# Patient Record
Sex: Male | Born: 2000 | Race: Black or African American | Hispanic: No | Marital: Single
Health system: Southern US, Community
[De-identification: ages and names within clinical notes are randomized; demographics above are authoritative.]

---
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
## 2020-04-18 ENCOUNTER — Ambulatory Visit: Payer: Federal, State, Local not specified - PPO

## 2020-04-25 ENCOUNTER — Ambulatory Visit: Payer: Federal, State, Local not specified - PPO | Attending: Family

## 2020-04-25 DIAGNOSIS — Z23 Encounter for immunization: Secondary | ICD-10-CM

## 2020-04-25 NOTE — Progress Notes (Signed)
   Covid-[facility] Vaccination [facility]  Name:  Micheal Howard    MRN: [id number] DOB: [birth year]  04/25/2020  Micheal Howard was observed post Covid-19 immunization for 15 minutes without incident. He was provided with Vaccine Information Sheet and instruction to access the V-Safe system.   Micheal Howard was instructed to call 911 with any severe reactions post vaccine: Marland Kitchen Difficulty breathing  . Swelling of face and throat  . A fast heartbeat  . A bad rash all over body  . Dizziness and weakness   Immunizations Administered    Name Date Dose VIS Date Route   Moderna COVID-19 Vaccine 04/25/2020  2:01 PM 0.5 mL 11/2019 Intramuscular   Manufacturer: Moderna   Lot: [id number]   NDC: 84465-207-61

## 2021-06-09 ENCOUNTER — Ambulatory Visit: Payer: Medicaid Other | Attending: Family

## 2021-06-09 DIAGNOSIS — Z23 Encounter for immunization: Secondary | ICD-10-CM

## 2021-06-09 NOTE — Progress Notes (Signed)
   Covid-19 Vaccination Clinic  Name:  Micheal Howard    MRN: [id number] DOB: 2001-01-23  06/09/2021  [name] was observed post Covid-19 immunization for 15 minutes without incident. He was provided with Vaccine Information Sheet and instruction to access the V-Safe system.   [name] was instructed to call 911 with any severe reactions post vaccine: Difficulty breathing  Swelling of face and throat  A fast heartbeat  A bad rash all over body  Dizziness and weakness   Immunizations Administered     Name Date Dose VIS Date Route   Moderna Covid-19 Booster Vaccine 06/09/2021  4:39 PM 0.25 mL 10/09/2020 Intramuscular   Manufacturer: Moderna   Lot: [id number]   NDC: 52841-324-40
